# Patient Record
Sex: Male | Born: 1980 | Race: Black or African American | Hispanic: No | Marital: Single | Smoking: Light tobacco smoker
Health system: Southern US, Community
[De-identification: ages and names within clinical notes are randomized; demographics above are authoritative.]

---

## 2003-08-14 ENCOUNTER — Emergency Department (HOSPITAL_COMMUNITY): Admission: EM | Admit: 2003-08-14 | Discharge: 2003-08-14 | Payer: Self-pay | Admitting: Emergency Medicine

## 2003-11-24 ENCOUNTER — Emergency Department (HOSPITAL_COMMUNITY): Admission: EM | Admit: 2003-11-24 | Discharge: 2003-11-24 | Payer: Self-pay | Admitting: Family Medicine

## 2005-06-23 ENCOUNTER — Emergency Department (HOSPITAL_COMMUNITY): Admission: EM | Admit: 2005-06-23 | Discharge: 2005-06-24 | Payer: Self-pay | Admitting: Emergency Medicine

## 2006-01-15 ENCOUNTER — Emergency Department (HOSPITAL_COMMUNITY): Admission: EM | Admit: 2006-01-15 | Discharge: 2006-01-15 | Payer: Self-pay | Admitting: Emergency Medicine

## 2007-05-18 ENCOUNTER — Emergency Department (HOSPITAL_COMMUNITY): Admission: EM | Admit: 2007-05-18 | Discharge: 2007-05-18 | Payer: Self-pay | Admitting: Emergency Medicine

## 2009-02-23 ENCOUNTER — Emergency Department (HOSPITAL_COMMUNITY): Admission: EM | Admit: 2009-02-23 | Discharge: 2009-02-23 | Payer: Self-pay | Admitting: Emergency Medicine

## 2009-06-29 ENCOUNTER — Emergency Department (HOSPITAL_COMMUNITY): Admission: EM | Admit: 2009-06-29 | Discharge: 2009-06-29 | Payer: Self-pay | Admitting: Emergency Medicine

## 2010-01-01 ENCOUNTER — Emergency Department (HOSPITAL_COMMUNITY): Admission: EM | Admit: 2010-01-01 | Discharge: 2010-01-01 | Payer: Self-pay | Admitting: Emergency Medicine

## 2010-12-19 LAB — GLUCOSE, CAPILLARY: Glucose-Capillary: 96 mg/dL (ref 70–99)

## 2017-07-28 ENCOUNTER — Encounter (HOSPITAL_COMMUNITY): Payer: Self-pay | Admitting: Emergency Medicine

## 2017-07-28 ENCOUNTER — Emergency Department (HOSPITAL_COMMUNITY)
Admission: EM | Admit: 2017-07-28 | Discharge: 2017-07-28 | Disposition: A | Discharge status: Home / Self Care | Payer: Self-pay | Attending: Emergency Medicine | Admitting: Emergency Medicine

## 2017-07-28 DIAGNOSIS — T162XXA Foreign body in left ear, initial encounter: Secondary | ICD-10-CM | POA: Insufficient documentation

## 2017-07-28 DIAGNOSIS — Y939 Activity, unspecified: Secondary | ICD-10-CM | POA: Insufficient documentation

## 2017-07-28 DIAGNOSIS — F17210 Nicotine dependence, cigarettes, uncomplicated: Secondary | ICD-10-CM | POA: Insufficient documentation

## 2017-07-28 DIAGNOSIS — Y929 Unspecified place or not applicable: Secondary | ICD-10-CM | POA: Insufficient documentation

## 2017-07-28 DIAGNOSIS — X58XXXA Exposure to other specified factors, initial encounter: Secondary | ICD-10-CM | POA: Insufficient documentation

## 2017-07-28 DIAGNOSIS — Y999 Unspecified external cause status: Secondary | ICD-10-CM | POA: Insufficient documentation

## 2017-07-28 NOTE — ED Provider Notes (Signed)
  [facility]-EMERGENCY DEPT Provider Note   CSN: [id number] Arrival date & time: 07/28/17  1250     History   Chief Complaint No chief complaint on file.   HPI Austin Everett is a 36 y.o. male.  Pt comes in with c/o fb in left ear. He states that the cotton from a qtip is in his ear from earlier today. He states that he put peroxide in the area to try and get it out earlier today. Denies pain. Just some pressure      History reviewed. No pertinent past medical history.  There are no active problems to display for this patient.   History reviewed. No pertinent surgical history.     Home Medications    Prior to Admission medications   Not on File    Family History No family history on file.  Social History Social History  Substance Use Topics  . Smoking status: Light Tobacco Smoker    Types: Cigars  . Smokeless tobacco: Never Used     Comment: occasional black and mild   . Alcohol use Yes     Comment: socially     Allergies   Patient has no known allergies.   Review of Systems Review of Systems  All other systems reviewed and are negative.    Physical Exam Updated Vital Signs BP (!) 128/96 (BP Location: Right Arm)   Pulse 67   Temp 97.8 F (36.6 C) (Oral)   Resp 18   Ht 5\' 8"  (1.727 m)   Wt 90.7 kg (200 lb)   SpO2 93%   BMI 30.41 kg/m   Physical Exam  Constitutional: He appears well-developed.  HENT:  Head: Atraumatic.  White substance noted in left ear canal  Eyes: Pupils are equal, round, and reactive to light.  Cardiovascular: Normal rate.   Pulmonary/Chest: Effort normal.  Musculoskeletal: Normal range of motion.  Vitals reviewed.    ED Treatments / Results  Labs (all labs ordered are listed, but only abnormal results are displayed) Labs Reviewed - No data to display  EKG  EKG Interpretation None       Radiology No results found.  Procedures .Foreign Body Removal Date/Time: 07/28/2017 2:21  PM Performed by: Teressa LowerPICKERING, Veona Bittman Authorized by: Teressa LowerPICKERING, Keta [place]  Consent: Verbal consent obtained. Patient identity confirmed: verbally with patient Time out: Immediately prior to procedure a "time out" was called to verify the correct patient, procedure, equipment, support staff and site/side marked as required. Body area: ear Location details: left ear Localization method: visualized Removal mechanism: forceps Complexity: simple 1 objects recovered. Objects recovered: cotton ball Post-procedure assessment: foreign body removed Patient tolerance: Patient tolerated the procedure well with no immediate complications   (including critical care time)  Medications Ordered in ED Medications - No data to display   Initial Impression / Assessment and Plan / ED Course  I have reviewed the triage vital signs and the nursing notes.  Pertinent labs & imaging results that were available during my care of the patient were reviewed by me and considered in my medical decision making (see chart for details).     Cotton ball removed  Final Clinical Impressions(s) / ED Diagnoses   Final diagnoses:  Foreign body of left ear, initial encounter    New Prescriptions New Prescriptions   No medications on file     Teressa Lowerickering, Nikea Settle, NP 07/28/17 1422    Shaune PollackIsaacs, Cameron, MD 07/29/17 1148

## 2017-07-28 NOTE — ED Triage Notes (Signed)
Pt comes in with complaints of the cotton from a q-tip stuck in his right ear after cleaning his ears this morning.  Denies hearing loss.  No other complaints at this time.

## 2017-07-28 NOTE — ED Notes (Signed)
Pt is alert and oriented x 4 and is verbally responsive. Pt reports that the cotton on his Q-tip came out inside his rt ear this morning. Pt denies pain at this time but feels a funny feeling to his rt ear.

## 2019-02-16 ENCOUNTER — Emergency Department (HOSPITAL_COMMUNITY): Payer: No Typology Code available for payment source

## 2019-02-16 ENCOUNTER — Emergency Department (HOSPITAL_COMMUNITY)
Admission: EM | Admit: 2019-02-16 | Discharge: 2019-02-16 | Disposition: A | Discharge status: Home / Self Care | Payer: No Typology Code available for payment source | Attending: Emergency Medicine | Admitting: Emergency Medicine

## 2019-02-16 ENCOUNTER — Encounter (HOSPITAL_COMMUNITY): Payer: Self-pay

## 2019-02-16 ENCOUNTER — Other Ambulatory Visit: Payer: Self-pay

## 2019-02-16 DIAGNOSIS — F17200 Nicotine dependence, unspecified, uncomplicated: Secondary | ICD-10-CM | POA: Diagnosis not present

## 2019-02-16 DIAGNOSIS — M25571 Pain in right ankle and joints of right foot: Secondary | ICD-10-CM | POA: Insufficient documentation

## 2019-02-16 DIAGNOSIS — M542 Cervicalgia: Secondary | ICD-10-CM | POA: Insufficient documentation

## 2019-02-16 MED ORDER — ACETAMINOPHEN 325 MG PO TABS
650.0000 mg | ORAL_TABLET | Freq: Once | ORAL | Status: AC
  Administered 2019-02-16: 650 mg via ORAL
  Filled 2019-02-16: qty 2
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
## 2019-02-16 NOTE — ED Notes (Signed)
Discharge paperwork reviewed with pt, pt verbalized understanding.  Pt stated he was comfortable walking to entrance.
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
## 2019-02-16 NOTE — Discharge Instructions (Addendum)

## 2019-07-07 ENCOUNTER — Ambulatory Visit
Admission: EM | Admit: 2019-07-07 | Discharge: 2019-07-07 | Disposition: A | Discharge status: Home / Self Care | Payer: Self-pay | Attending: Physician Assistant | Admitting: Physician Assistant

## 2019-07-07 DIAGNOSIS — M545 Low back pain, unspecified: Secondary | ICD-10-CM

## 2019-07-07 DIAGNOSIS — M542 Cervicalgia: Secondary | ICD-10-CM

## 2019-07-07 MED ORDER — METHOCARBAMOL 500 MG PO TABS: 500.0000 mg | ORAL_TABLET | Freq: Two times a day (BID) | ORAL | 0 refills | Status: AC

## 2019-07-07 MED ORDER — MELOXICAM 7.5 MG PO TABS: 7.5000 mg | ORAL_TABLET | Freq: Every day | ORAL | 0 refills | Status: AC

## 2019-07-07 NOTE — Discharge Instructions (Signed)
Start Mobic. Do not take ibuprofen (motrin/advil)/ naproxen (aleve) while on mobic.  Robaxin as needed, this can make you drowsy, so do not take if you are going to drive, operate heavy machinery, or make important decisions. Ice/heat compresses as needed. Follow up with sports medicine for further evaluation if symptoms still not improving. If experience numbness/tingling of the inner thighs, loss of bladder or bowel control, go to the emergency department for evaluation.
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
## 2024-09-05 ENCOUNTER — Emergency Department (HOSPITAL_BASED_OUTPATIENT_CLINIC_OR_DEPARTMENT_OTHER): Payer: Self-pay

## 2024-09-05 ENCOUNTER — Emergency Department (HOSPITAL_BASED_OUTPATIENT_CLINIC_OR_DEPARTMENT_OTHER)
Admission: EM | Admit: 2024-09-05 | Discharge: 2024-09-05 | Disposition: A | Discharge status: Home / Self Care | Payer: Self-pay | Attending: Emergency Medicine | Admitting: Emergency Medicine

## 2024-09-05 ENCOUNTER — Other Ambulatory Visit: Payer: Self-pay

## 2024-09-05 ENCOUNTER — Encounter (HOSPITAL_BASED_OUTPATIENT_CLINIC_OR_DEPARTMENT_OTHER): Payer: Self-pay | Admitting: Emergency Medicine

## 2024-09-05 DIAGNOSIS — T189XXA Foreign body of alimentary tract, part unspecified, initial encounter: Secondary | ICD-10-CM

## 2024-09-05 NOTE — Discharge Instructions (Signed)
 It was a pleasure taking care of you here today  Keep a close watch on your symptoms.  If you start coughing or vomiting up blood you need to be seen again  You may have a scratchy sensation to the back of your throat.  You may use over-the-counter Chloraseptic or numbing spray for this  Make sure to follow-up outpatient, return for worsening symptoms
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
## 2024-09-05 NOTE — ED Provider Notes (Signed)
 [place] [facility] AT [facility] Provider Note   CSN: [id number] Arrival date & time: 09/05/24  1859     Patient presents with: Foreign Body   Austin Everett is a 43 y.o. male here for evaluation of swallowed foreign body.  He was eating at a restaurant, was eating rice went to swallow and he felt something hard when he swallowed.  He was subsequently able to cough it back up.  He is here to ensure that he suffered no oral trauma.  He denies any pain, difficulty swallowing.  Actively drinking on my evaluation.  No shortness of breath.   HPI     Prior to Admission medications   Medication Sig Start Date End Date Taking? Authorizing Provider  meloxicam  (MOBIC ) 7.5 MG tablet Take 1 tablet (7.5 mg total) by mouth daily. 07/07/19   Babara, Amy V, PA-C  methocarbamol  (ROBAXIN ) 500 MG tablet Take 1 tablet (500 mg total) by mouth 2 (two) times daily. 07/07/19   Babara Greig GAILS, PA-C    Allergies: Patient has no known allergies.    Review of Systems  Constitutional: Negative.   HENT: Negative.    Respiratory: Negative.    Cardiovascular: Negative.   Gastrointestinal: Negative.   Musculoskeletal: Negative.   Skin: Negative.   Neurological: Negative.   All other systems reviewed and are negative.   Updated Vital Signs BP 136/87 (BP Location: Right Arm)   Pulse 71   Temp 98.7 F (37.1 C) (Oral)   Resp 18   Ht 5' 7 (1.702 m)   Wt 90.7 kg   SpO2 99%   BMI 31.32 kg/m   Physical Exam Vitals and nursing note reviewed.  Constitutional:      General: He is not in acute distress.    Appearance: He is well-developed. He is not ill-appearing, toxic-appearing or diaphoretic.  HENT:     Head: Normocephalic and atraumatic.     Mouth/Throat:     Lips: Pink.     Mouth: Mucous membranes are moist.     Pharynx: Oropharynx is clear. Uvula midline.     Comments: Posterior pharynx clear.  No evidence of intraoral trauma.  Sublingual area soft.  No angioedema no pooling of  secretions Eyes:     Pupils: Pupils are equal, round, and reactive to light.  Neck:     Trachea: Trachea and phonation normal.     Comments: No stridor, full range of motion Cardiovascular:     Rate and Rhythm: Normal rate and regular rhythm.  Pulmonary:     Effort: Pulmonary effort is normal. No respiratory distress.     Breath sounds: Normal breath sounds and air entry.     Comments: Speaks in full sentences without difficulty Abdominal:     General: There is no distension.     Palpations: Abdomen is soft.  Musculoskeletal:        General: Normal range of motion.     Cervical back: Full passive range of motion without pain, normal range of motion and neck supple.  Skin:    General: Skin is warm and dry.  Neurological:     General: No focal deficit present.     Mental Status: He is alert and oriented to person, place, and time.     (all labs ordered are listed, but only abnormal results are displayed) Labs Reviewed - No data to display  EKG: None  Radiology: DG Abdomen Acute W/Chest Result Date: 09/05/2024 EXAM: UPRIGHT AND SUPINE XRAY VIEWS  OF THE ABDOMEN AND 4 VIEW(S) OF THE CHEST 09/05/2024 07:30:00 PM COMPARISON: None available. CLINICAL HISTORY: foreign body ingestion t almost swallowed a metal screw that was in rice at a restaurant (has it with him); denies throat pain or other sxs, FINDINGS: LUNGS AND PLEURA: No consolidation or pulmonary edema. No pleural effusion or pneumothorax. HEART AND MEDIASTINUM: No acute abnormality of the cardiac and mediastinal silhouettes. BOWEL: The bowel gas pattern is nonspecific. No bowel obstruction. PERITONEUM AND SOFT TISSUES: No retained radiopaque foreign body identified. No abnormal calcifications. No free air. BONES: No acute fracture. IMPRESSION: 1. No retained radiopaque foreign body identified. 2. No acute findings. Electronically signed by: Morgane Naveau MD 09/05/2024 07:42 PM EST RP Workstation: [id number]     Procedures    Medications Ordered in the ED - No data to display  43 here for evaluation of swallowed foreign object.  Eating at a restaurant when he subsequently swallowed a 1 cm screw that he was able to cough back up.  He is here for assessment.  Here his posterior pharynx is clear.  He has no obvious traumatic injury.  No hemoptysis or hematemesis.  His heart lungs are clear.  Abdomen soft, tender.  Able to eat and drink without difficulty.  Imaging personally viewed interpreted X-ray chest and abdomen without evidence of retained foreign object.  Discussed results with patient.  Will have him monitor his symptoms at home.  Follow-up outpatient, return for new or worsening symptoms.  Tolerating p.o. intake here.  No lacerations, open skin to suggest needing tetanus update  The patient has been appropriately medically screened and/or stabilized in the ED. I have low suspicion for any other emergent medical condition which would require further screening, evaluation or treatment in the ED or require inpatient management.  Patient is hemodynamically stable and in no acute distress.  Patient able to ambulate in department prior to ED.  Evaluation does not show acute pathology that would require ongoing or additional emergent interventions while in the emergency department or further inpatient treatment.  I have discussed the diagnosis with the patient and answered all questions.  Pain is been managed while in the emergency department and patient has no further complaints prior to discharge.  Patient is comfortable with plan discussed in room and is stable for discharge at this time.  I have discussed strict return precautions for returning to the emergency department.  Patient was encouraged to follow-up with PCP/specialist refer to at discharge.                                   Medical Decision Making Amount and/or Complexity of Data Reviewed External Data Reviewed: labs, radiology and notes. Radiology:  ordered and independent interpretation performed. Decision-making details documented in ED Course.  Risk OTC drugs. Decision regarding hospitalization. Diagnosis or treatment significantly limited by social determinants of health.        Final diagnoses:  Swallowed foreign body, initial encounter    ED Discharge Orders     None          Jaid Quirion A, PA-C 09/05/24 2000
